# Patient Record
Sex: Male | Born: 1990 | Race: Black or African American | Hispanic: No | Marital: Single | Smoking: Never smoker
Health system: Southern US, Community
[De-identification: ages and names within clinical notes are randomized; demographics above are authoritative.]

---
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
## 2012-09-21 ENCOUNTER — Emergency Department (HOSPITAL_COMMUNITY): Payer: 59

## 2012-09-21 ENCOUNTER — Encounter (HOSPITAL_COMMUNITY): Payer: Self-pay

## 2012-09-21 ENCOUNTER — Emergency Department (HOSPITAL_COMMUNITY)
Admission: EM | Admit: 2012-09-21 | Discharge: 2012-09-21 | Disposition: A | Discharge status: Home / Self Care | Payer: 59 | Attending: Emergency Medicine | Admitting: Emergency Medicine

## 2012-09-21 DIAGNOSIS — S43016A Anterior dislocation of unspecified humerus, initial encounter: Secondary | ICD-10-CM | POA: Insufficient documentation

## 2012-09-21 DIAGNOSIS — R296 Repeated falls: Secondary | ICD-10-CM | POA: Insufficient documentation

## 2012-09-21 DIAGNOSIS — R209 Unspecified disturbances of skin sensation: Secondary | ICD-10-CM | POA: Insufficient documentation

## 2012-09-21 DIAGNOSIS — S43004A Unspecified dislocation of right shoulder joint, initial encounter: Secondary | ICD-10-CM

## 2012-09-21 DIAGNOSIS — Y9210 Unspecified residential institution as the place of occurrence of the external cause: Secondary | ICD-10-CM | POA: Insufficient documentation

## 2012-09-21 DIAGNOSIS — Y9389 Activity, other specified: Secondary | ICD-10-CM | POA: Insufficient documentation

## 2012-09-21 MED ORDER — OXYCODONE-ACETAMINOPHEN 5-325 MG PO TABS: 1.0000 | ORAL_TABLET | Freq: Four times a day (QID) | ORAL | Status: DC | PRN

## 2012-09-21 NOTE — ED Provider Notes (Signed)
CSN: [id number]     Arrival date & time 09/21/12  1441 History   First MD Initiated Contact with Patient 09/21/12 1647     Chief Complaint  Patient presents with  . Shoulder Pain   (Consider location/radiation/quality/duration/timing/severity/associated sxs/prior Treatment) Patient is a 22 y.o. male presenting with shoulder pain. The history is provided by the patient.  Shoulder Pain This is a new problem. Pertinent negatives include no chest pain, no abdominal pain, no headaches and no shortness of breath.   patient fell yesterday and has had pain in his right shoulder since. He's had decreased ability to move it. He states he has some numbness shoulder. He states he's previously dislocated her shoulder but he was able to get it back in on its own. He states that he has been pulling on it and the other prisoners in the jail were pulling on it also.  History reviewed. No pertinent past medical history. History reviewed. No pertinent past surgical history. No family history on file. History  Substance Use Topics  . Smoking status: Never Smoker   . Smokeless tobacco: Not on file  . Alcohol Use: No    Review of Systems  Constitutional: Negative for activity change and appetite change.  HENT: Negative for neck stiffness.   Eyes: Negative for pain.  Respiratory: Negative for chest tightness and shortness of breath.   Cardiovascular: Negative for chest pain and leg swelling.  Gastrointestinal: Negative for nausea, vomiting, abdominal pain and diarrhea.  Genitourinary: Negative for flank pain.  Musculoskeletal: Negative for back pain.       Right shoulder pain and decreased movement  Skin: Negative for rash.  Neurological: Positive for numbness. Negative for weakness and headaches.  Psychiatric/Behavioral: Negative for behavioral problems.    Allergies  Review of patient's allergies indicates no known allergies.  Home Medications   Current Outpatient Rx  Name  Route  Sig  Dispense   Refill  . ibuprofen (ADVIL,MOTRIN) 200 MG tablet   Oral   Take 400 mg by mouth every 8 (eight) hours as needed for pain.         Marland Kitchen oxyCODONE-acetaminophen (PERCOCET/ROXICET) 5-325 MG per tablet   Oral   Take 1-2 tablets by mouth every 6 (six) hours as needed for pain.   10 tablet   0    BP 150/74  Pulse 65  Temp(Src) 97.9 F (36.6 C) (Oral)  Resp 20  SpO2 100% Physical Exam  Constitutional: He is oriented to person, place, and time. He appears well-developed and well-nourished.  HENT:  Head: Normocephalic and atraumatic.  Eyes: Pupils are equal, round, and reactive to light.  Neck: Normal range of motion. Neck supple.  Cardiovascular: Normal rate and regular rhythm.   Pulmonary/Chest: Effort normal and breath sounds normal.  Abdominal: Soft.  Musculoskeletal: He exhibits tenderness. He exhibits no edema.  Decreased range of motion in right shoulder. He suffered a 40 with fullness anteriorly. Neurovascular intact over hand. Strong radial pulse. Decrease sensation over the right axillary muscle.  Neurological: He is alert and oriented to person, place, and time.  Skin: Skin is warm. No erythema.    ED Course  ORTHOPEDIC INJURY TREATMENT Date/Time: 09/21/2012 6:12 PM Performed by: Benjiman Core R. Authorized by: Billee Cashing Consent: Verbal consent obtained. written consent not obtained. Risks and benefits: risks, benefits and alternatives were discussed Consent given by: patient Patient understanding: patient states understanding of the procedure being performed Patient identity confirmed: verbally with patient and arm band Injury location:  shoulder Location details: right shoulder Injury type: dislocation Dislocation type: anterior Hill-Sachs deformity: no Chronicity: recurrent Pre-procedure distal perfusion: normal Pre-procedure neurological function: diminished Pre-procedure range of motion: reduced Local anesthesia used: no Patient sedated:  no Manipulation performed: yes Reduction successful: yes X-ray confirmed reduction: yes Immobilization: sling Post-procedure distal perfusion: normal Post-procedure neurological function: diminished Post-procedure range of motion: improved Patient tolerance: Patient tolerated the procedure well with no immediate complications.   (including critical care time) Labs Review Labs Reviewed - No data to display Imaging Review Dg Shoulder Right  09/21/2012   *RADIOLOGY REPORT*  Clinical Data: Status post shoulder reduction.  RIGHT SHOULDER - 2+ VIEW  Comparison: 09/21/2012.  Findings: Previously noted shoulder dislocation has been reduced. The humeral head is now located within the glenoid fossa.  No definite Hill-Sachs type fracture is confidently identified.  The inferior aspect of the glenoid appears intact.  IMPRESSION: 1.  Status post relocation of the right shoulder dislocation, which now appears properly located.   Original Report Authenticated By: Trudie Reed, M.D.   Dg Shoulder Right  09/21/2012   CLINICAL DATA:  22 year old male with right shoulder pain.  EXAM: RIGHT SHOULDER - 2+ VIEW  COMPARISON:  None.  FINDINGS: Anterior subcoracoid glenohumeral joint dislocation. No definite acute fracture. Right clavicle intact. Negative visualized right ribs and lung parenchyma.  IMPRESSION: Anterior, subcoracoid right glenohumeral joint dislocation.   Electronically Signed   By: Augusto Gamble   On: 09/21/2012 15:31    MDM   1. Shoulder dislocation, right, initial encounter    Patient with shoulder dislocation. Reduced by myself. Patient given swelling and pain medicine. Will followup with orthopedic.    Juliet Rude. Rubin Payor, MD 09/21/12 [id number]

## 2012-09-21 NOTE — ED Notes (Signed)
[place] [facility], states fell off top bunk landing on rt shoulder, possible dislocation, states hx of same

## 2014-09-14 IMAGING — CR DG SHOULDER 2+V*R*
2 series · 2 of 2 positions shown · non-contrast
Comparison: 09/21/2012.

CLINICAL DATA: Status post shoulder reduction.

RIGHT SHOULDER - 2+ VIEW

[w shoulder internal right]
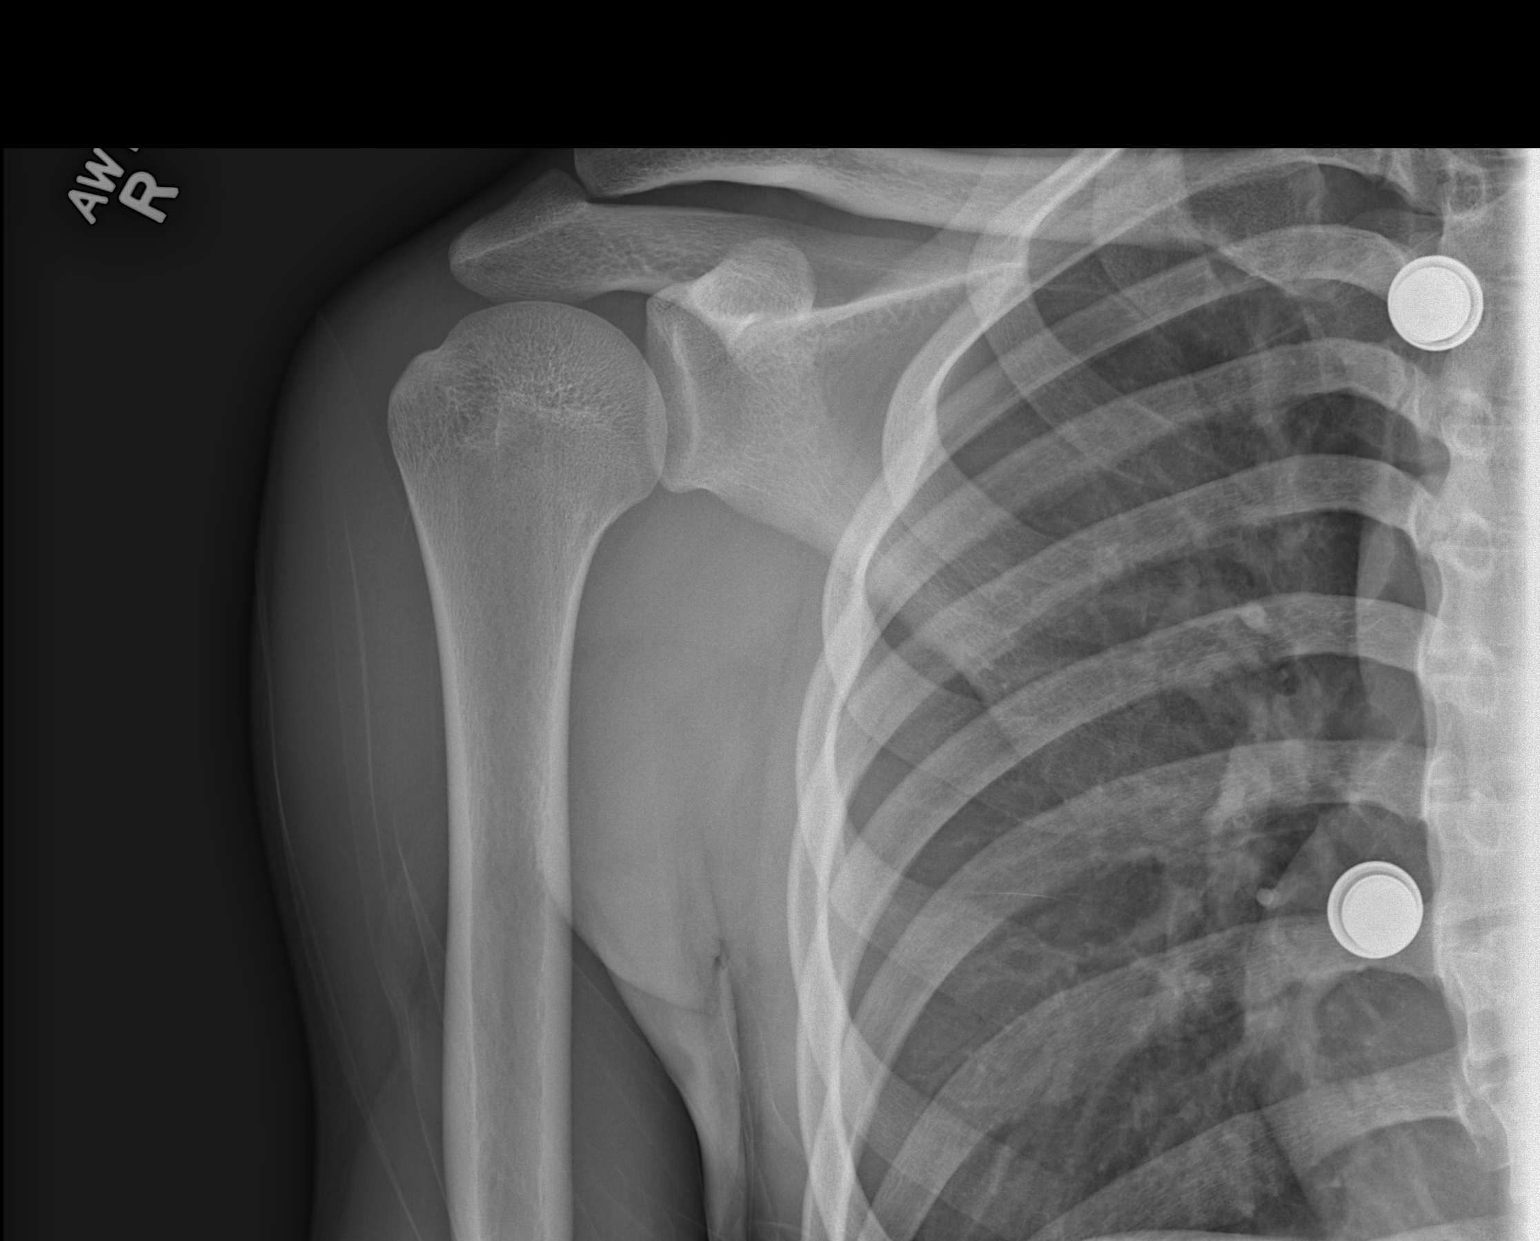

[w shoulder y-view right]
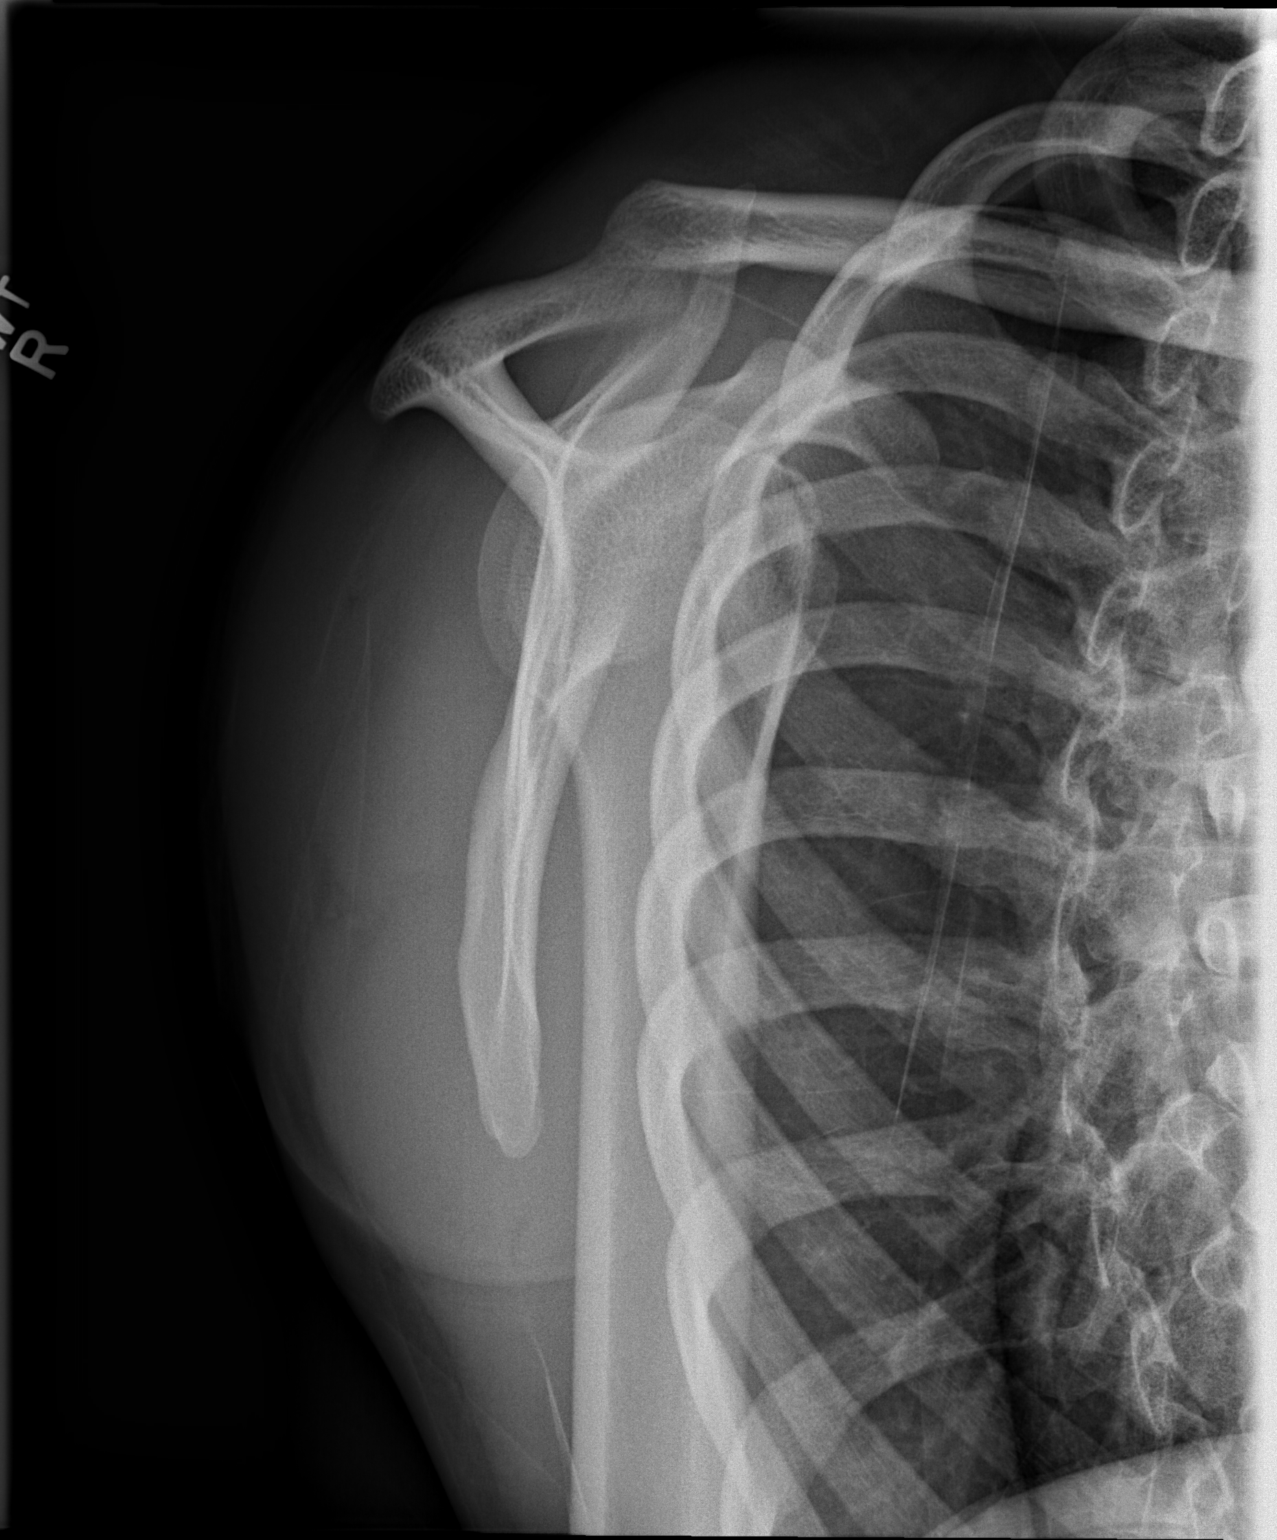

[2 of 2 positions shown; findings below may reference images not displayed]

FINDINGS: Previously noted shoulder dislocation has been reduced.
The humeral head is now located within the glenoid fossa.  No
definite Hill-Sachs type fracture is confidently identified.  The
inferior aspect of the glenoid appears intact.
IMPRESSION: 1.  Status post relocation of the right shoulder dislocation, which
now appears properly located.

## 2022-01-06 ENCOUNTER — Emergency Department (HOSPITAL_COMMUNITY)
Admission: EM | Admit: 2022-01-06 | Discharge: 2022-01-06 | Attending: Emergency Medicine | Admitting: Emergency Medicine

## 2022-01-06 DIAGNOSIS — R410 Disorientation, unspecified: Secondary | ICD-10-CM | POA: Insufficient documentation

## 2022-01-06 DIAGNOSIS — R55 Syncope and collapse: Secondary | ICD-10-CM | POA: Diagnosis not present

## 2022-01-06 DIAGNOSIS — R001 Bradycardia, unspecified: Secondary | ICD-10-CM | POA: Insufficient documentation

## 2022-01-06 DIAGNOSIS — R404 Transient alteration of awareness: Secondary | ICD-10-CM

## 2022-01-06 LAB — COMPREHENSIVE METABOLIC PANEL
ALT: 31 U/L (ref 0–44)
AST: 26 U/L (ref 15–41)
Albumin: 3.9 g/dL (ref 3.5–5.0)
Alkaline Phosphatase: 50 U/L (ref 38–126)
Anion gap: 7 (ref 5–15)
BUN: 6 mg/dL (ref 6–20)
CO2: 27 mmol/L (ref 22–32)
Calcium: 8.9 mg/dL (ref 8.9–10.3)
Chloride: 108 mmol/L (ref 98–111)
Creatinine, Ser: 1.06 mg/dL (ref 0.61–1.24)
GFR, Estimated: >60 mL/min (ref >60)
Glucose, Bld: 99 mg/dL (ref 70–99)
Potassium: 4.5 mmol/L (ref 3.5–5.1)
Sodium: 142 mmol/L (ref 135–145)
Total Bilirubin: 0.5 mg/dL (ref 0.3–1.2)
Total Protein: 6.7 g/dL (ref 6.5–8.1)

## 2022-01-06 LAB — I-STAT VENOUS BLOOD GAS, ED
Acid-Base Excess: 4 mmol/L — ABNORMAL HIGH (ref 0.0–2.0)
Bicarbonate: 30.5 mmol/L — ABNORMAL HIGH (ref 20.0–28.0)
Calcium, Ion: 1.16 mmol/L (ref 1.15–1.40)
HCT: 44 % (ref 39.0–52.0)
Hemoglobin: 15 g/dL (ref 13.0–17.0)
O2 Saturation: 44 %
Potassium: 4.5 mmol/L (ref 3.5–5.1)
Sodium: 143 mmol/L (ref 135–145)
TCO2: 32 mmol/L (ref 22–32)
pCO2, Ven: 53.2 mmHg (ref 44–60)
pH, Ven: 7.367 (ref 7.25–7.43)
pO2, Ven: 26 mmHg — CL (ref 32–45)

## 2022-01-06 LAB — CBC WITH DIFFERENTIAL/PLATELET
Abs Immature Granulocytes: 0.01 10*3/uL (ref 0.00–0.07)
Basophils Absolute: 0.1 10*3/uL (ref 0.0–0.1)
Basophils Relative: 1 %
Eosinophils Absolute: 0.2 10*3/uL (ref 0.0–0.5)
Eosinophils Relative: 4 %
HCT: 43.8 % (ref 39.0–52.0)
Hemoglobin: 14.5 g/dL (ref 13.0–17.0)
Immature Granulocytes: 0 %
Lymphocytes Relative: 21 %
Lymphs Abs: 1.4 10*3/uL (ref 0.7–4.0)
MCH: 30.8 pg (ref 26.0–34.0)
MCHC: 33.1 g/dL (ref 30.0–36.0)
MCV: 93 fL (ref 80.0–100.0)
Monocytes Absolute: 0.4 10*3/uL (ref 0.1–1.0)
Monocytes Relative: 7 %
Neutro Abs: 4.4 10*3/uL (ref 1.7–7.7)
Neutrophils Relative %: 67 %
Platelets: 276 10*3/uL (ref 150–400)
RBC: 4.71 MIL/uL (ref 4.22–5.81)
RDW: 11.9 % (ref 11.5–15.5)
WBC: 6.5 10*3/uL (ref 4.0–10.5)
nRBC: 0 % (ref 0.0–0.2)

## 2022-01-06 LAB — I-STAT CHEM 8, ED
BUN: 6 mg/dL (ref 6–20)
Calcium, Ion: 1.17 mmol/L (ref 1.15–1.40)
Chloride: 104 mmol/L (ref 98–111)
Creatinine, Ser: 1 mg/dL (ref 0.61–1.24)
Glucose, Bld: 97 mg/dL (ref 70–99)
HCT: 45 % (ref 39.0–52.0)
Hemoglobin: 15.3 g/dL (ref 13.0–17.0)
Potassium: 4.4 mmol/L (ref 3.5–5.1)
Sodium: 143 mmol/L (ref 135–145)
TCO2: 29 mmol/L (ref 22–32)

## 2022-01-06 NOTE — Discharge Instructions (Signed)
Ricardo Melton was seen in the emergency room for unresponsiveness.  He had received Narcan prior to emergency room assessment.  He did not require any further Narcan.  It is not entirely clear if there was an overdose, but Ricardo Melton did not receive any intervention in the emergency room.  All of his basic labs including renal function, hemoglobin, white count are normal.  Over time, Ricardo Melton became more responsive.  He has tolerated oral intake.  We would recommend that Ricardo Melton be monitored or observed closely upon arrival tonight.  Neurochecks every 2 hours are recommended until midnight.  If there is any change in his mental status, please bring him to the emergency room.

## 2022-01-06 NOTE — ED Triage Notes (Signed)
Pt BIB [facility] in sheriff custody from jail after being discovered unresponsive in his cell. Patient given 12mg  of narcan prior to EMS arrival. Patient currently sleeping with pinpoint pupils, HR 49, O2 98% 2L, 13 RR. Patient wakes to sternal rub only.

## 2022-01-06 NOTE — ED Notes (Signed)
Pt alert, eating.

## 2022-01-06 NOTE — ED Notes (Signed)
Pt d/c back to [facility] custody per EDP order. Discharge summary reviewed, verbalize understanding. No s/s of acute distress noted at discharge.

## 2022-01-06 NOTE — ED Notes (Signed)
Patient remains somnolent at this time, patient will wake to painful stimuli but falls back asleep immediately after. Breathing is regular and unlabored, airway remains patent.

## 2022-01-06 NOTE — ED Provider Notes (Signed)
[facility] EMERGENCY DEPARTMENT Provider Note   CSN: [id number] Arrival date & time: 01/06/22  1439     History  Chief Complaint  Patient presents with   Altered Mental Status    Latoya Zane is a 31 y.o. male.  HPI    31 year old male comes in with chief complaint of altered mental status from the prison system. According to the police who has accompanied the patient, patient was found unresponsive in his jail.  He was witnessed 2 hours prior to that episode and was appropriate.  Patient had received Narcan prior to EMS arrival.  Nursing note indicates 12 mg of Narcan was given prior to EMS arrival, police at the bedside does not know how much Narcan was given.   Level 5 caveat for altered mental status.   Home Medications Prior to Admission medications   Medication Sig Start Date End Date Taking? Authorizing Provider  ibuprofen (ADVIL,MOTRIN) 200 MG tablet Take 400 mg by mouth every 8 (eight) hours as needed for pain.    [provider]  oxyCODONE-acetaminophen (PERCOCET/ROXICET) 5-325 MG per tablet Take 1-2 tablets by mouth every 6 (six) hours as needed for pain. 09/21/12   Benjiman Core, MD      Allergies    Patient has no known allergies.    Review of Systems   Review of Systems  Physical Exam Updated Vital Signs BP 132/70   Pulse (!) 53   Temp (!) 97.5 F (36.4 C) (Axillary)   Resp 18   SpO2 94%  Physical Exam Vitals and nursing note reviewed.  Constitutional:      Appearance: He is well-developed.     Comments: Responsive to sternal rub  HENT:     Head: Atraumatic.  Eyes:     Comments: Pupils are pinpoint  Cardiovascular:     Rate and Rhythm: Bradycardia present.     Pulses: Normal pulses.  Pulmonary:     Effort: Pulmonary effort is normal.     Breath sounds: No stridor. No wheezing.  Abdominal:     Tenderness: There is no abdominal tenderness.  Musculoskeletal:     Cervical back: Neck supple.  Skin:    General:  Skin is warm.  Neurological:     Mental Status: He is disoriented.     Comments: Patient mumbling, protecting airway     ED Results / Procedures / Treatments   Labs (all labs ordered are listed, but only abnormal results are displayed) Labs Reviewed  I-STAT VENOUS BLOOD GAS, ED - Abnormal; Notable for the following components:      Result Value   pO2, Ven 26 (*)    Bicarbonate 30.5 (*)    Acid-Base Excess 4.0 (*)    All other components within normal limits  COMPREHENSIVE METABOLIC PANEL  CBC WITH DIFFERENTIAL/PLATELET  BLOOD GAS, VENOUS  I-STAT CHEM 8, ED    EKG None  Radiology No results found.  Procedures .Critical Care  Performed by: Derwood Kaplan, MD Authorized by: Derwood Kaplan, MD   Critical care provider statement:    Critical care time (minutes):  48   Critical care was necessary to treat or prevent imminent or life-threatening deterioration of the following conditions:  Toxidrome and CNS failure or compromise   Critical care was time spent personally by me on the following activities:  Development of treatment plan with patient or surrogate, discussions with consultants, evaluation of patient's response to treatment, examination of patient, ordering and review of laboratory studies, ordering and  review of radiographic studies, ordering and performing treatments and interventions, pulse oximetry, re-evaluation of patient's condition and review of old charts     Medications Ordered in ED Medications - No data to display  ED Course/ Medical Decision Making/ A&P Clinical Course as of 01/06/22 Halford Chessman Jan 06, 2022  1630 Patient reassessed.  Labs are reassuring including venous blood gas that shows pH of 7.367.  Patient still very somnolent, arousable to sternal rub only. [AN]  1730 Patient still arousable to sternal rub.  Labs are reassuring.  Pupils are about 2 mm.  No hypercapnia or hypoxia noted.  Hemodynamically stable. [AN]  1828 Patient responded to  sternal rub, now able to communicate. Patient states that felt like his throat was closing and he could not breathe.  He denies taking any substance.  He has no known history of allergies.  He denied any chest pain, shortness of breath prior to the episode.  He does not recall if the symptoms were after he had something to eat.  High clinical suspicion for overdose. Patient had no point had hypoxia, wheezing, rash and currently his oral exam is reassuring, there is no evidence of any stridor.  Clinically does not appear that he had anaphylaxis.  He was never given any antidotes for anaphylaxis, and he has improved.  He is requesting food.  P.o. challenge initiated.  Anticipate discharge. [AN]    Clinical Course User Index [AN] Derwood Kaplan, MD                           Medical Decision Making 31 year old male comes to the emergency room with chief complaint of unresponsiveness.  According to the police at the bedside, 2 hours prior to assessment he was found fine.  Thereafter he was found unresponsive.  Narcan was administered.  EMS report consistent with Narcan administered.  Patient has pinpoint pupils.  He is arousable to sternal rub.  Differential diagnosis at this time includes accidental overdose with opiate, alcohol intoxication, suicide attempt, severe electrolyte abnormality.  Patient has regular rhythm, heart rate in the 50s.  He is moving all 4 extremities to noxious stimuli.  Low suspicion for stroke.  Plan is to observe him in the ER, will get basic labs.  Clinically appears that this is opiate overdose. When he is more arousable, will get more comprehensive history.  Problems Addressed: Unresponsive episode: acute illness or injury that poses a threat to life or bodily functions  Amount and/or Complexity of Data Reviewed Labs: ordered.    Final Clinical Impression(s) / ED Diagnoses Final diagnoses:  Unresponsive episode    Rx / DC Orders ED Discharge Orders      None         Derwood Kaplan, MD 01/06/22 Paulo Fruit

## 2022-09-08 ENCOUNTER — Ambulatory Visit (HOSPITAL_COMMUNITY)
Admission: EM | Admit: 2022-09-08 | Discharge: 2022-09-08 | Disposition: A | Discharge status: Home / Self Care | Payer: No Payment, Other | Attending: Registered Nurse | Admitting: Registered Nurse

## 2022-09-08 ENCOUNTER — Encounter (HOSPITAL_COMMUNITY): Payer: Self-pay | Admitting: Registered Nurse

## 2022-09-08 DIAGNOSIS — F209 Schizophrenia, unspecified: Secondary | ICD-10-CM | POA: Insufficient documentation

## 2022-09-08 DIAGNOSIS — Z760 Encounter for issue of repeat prescription: Secondary | ICD-10-CM

## 2022-09-08 DIAGNOSIS — Z652 Problems related to release from prison: Secondary | ICD-10-CM | POA: Insufficient documentation

## 2022-09-08 MED ORDER — OLANZAPINE 15 MG PO TABS: 30.0000 mg | ORAL_TABLET | Freq: Every day | ORAL | 0 refills | Status: AC

## 2022-09-08 MED ORDER — CARBAMAZEPINE ER 100 MG PO TB12: 300.0000 mg | ORAL_TABLET | Freq: Two times a day (BID) | ORAL | 0 refills | Status: AC

## 2022-09-08 MED ORDER — BUSPIRONE HCL 15 MG PO TABS: ORAL_TABLET | ORAL | 0 refills | Status: AC

## 2022-09-08 NOTE — ED Provider Notes (Signed)
[facility] Medical Screening Exam  Patient Name: Ricardo Melton MRN: [id number] Date of Evaluation: 09/08/22 Chief Complaint:   Diagnosis:  Final diagnoses:  Schizophrenia, unspecified type (HCC)  Encounter for medication refill    History of Present illness: Ricardo Melton is a 32 y.o. male patient presented to [facility] as a walk in voluntarily requesting outpatient psychiatric services and medication refill.    Josefa Half, 32 y.o., male patient seen face to face by this provider, chart reviewed, and consulted with Dr. Nelly Rout on 09/08/22.  On evaluation Ricardo Melton reports he was released from jail 3 days ago and was not given a prescription for his medication.  States while in jail he was receiving Lamictal 300 mg 2 x day, Zyprexa 30 mg at bed time, and Buspar but doesn't remember the dosage.  Patient states he has a history of schizophrenia.  He denies suicidal/self-harm/homicidal ideation, psychosis, and paranoia at this time but is wanting to get back on his psychotropic medications.  Unable to contact pharmacy since patient was getting medications while in jail, and he doesn't have any bottles to show how much of each medication he was taking.  Consulted with pharmacy to see if able to check medication dosages.  .   During evaluation Ricardo Melton is seated in exam room with no noted distress.  He is alert/oriented x 4, calm, cooperative, attentive, and responses were relevant and appropriate to assessment questions.  He spoke in a clear tone at moderate volume, and normal pace, with good eye contact.   He denies suicidal/self-harm/homicidal ideation, psychosis, and paranoia.  Objectively there is no evidence of psychosis/mania or delusional thinking.  He conversed coherently, with goal directed thoughts, and no distractibility, or pre-occupation.  At this time Ricardo Melton is educated and verbalizes understanding of mental health resources and other crisis services in  the community. He is instructed to call 911 and present to the nearest emergency room should he experience any suicidal/homicidal ideation, auditory/visual/hallucinations, or detrimental worsening of his mental health condition.  He was a also advised by Clinical research associate that he could call the toll-free phone on back of Medicaid card to speak with care coordinator.  F/U with [facility] for medication management   Flowsheet Row ED from 09/08/2022 in [facility]  C-SSRS RISK CATEGORY No Risk       Psychiatric Specialty Exam  Presentation  General Appearance:Appropriate for Environment  Eye Contact:Good  Speech:Clear and Coherent  Speech Volume:Normal  Handedness:Right   Mood and Affect  Mood: Euthymic  Affect: Appropriate; Congruent   Thought Process  Thought Processes: Coherent; Goal Directed  Descriptions of Associations:Intact  Orientation:Full (Time, Place and Person)  Thought Content:Logical    Hallucinations:None  Ideas of Reference:None  Suicidal Thoughts:No  Homicidal Thoughts:No   Sensorium  Memory: Immediate Good; Recent Good  Judgment: Intact  Insight: Present   Executive Functions  Concentration: Good  Attention Span: Good  Recall: Good  Fund of Knowledge: Good  Language: Good   Psychomotor Activity  Psychomotor Activity: Normal   Assets  Assets: Communication Skills; Desire for Improvement; Housing; Social Support   Sleep  Sleep: Good  Number of hours: No data recorded  Physical Exam: Physical Exam Vitals and nursing note reviewed.  Constitutional:      General: He is not in acute distress.    Appearance: Normal appearance. He is not ill-appearing.  HENT:     Head: Normocephalic.  Eyes:     Conjunctiva/sclera: Conjunctivae  normal.  Cardiovascular:     Rate and Rhythm: Normal rate.  Pulmonary:     Effort: Pulmonary effort is normal.  Musculoskeletal:        General: Normal range of  motion.     Cervical back: Normal range of motion.  Skin:    General: Skin is warm and dry.  Neurological:     Mental Status: He is alert and oriented to person, place, and time.  Psychiatric:        Attention and Perception: Attention and perception normal. He does not perceive auditory or visual hallucinations.        Mood and Affect: Mood and affect normal.        Speech: Speech normal.        Behavior: Behavior normal. Behavior is cooperative.        Thought Content: Thought content normal. Thought content is not paranoid or delusional. Thought content does not include homicidal or suicidal ideation.        Cognition and Memory: Cognition normal.        Judgment: Judgment normal.    Review of Systems  Constitutional:        No other complaints voiced   All other systems reviewed and are negative.  Blood pressure (!) 141/83, pulse 78, temperature 97.9 F (36.6 C), temperature source Oral, resp. rate 18, SpO2 98%. There is no height or weight on file to calculate BMI.  Musculoskeletal: Strength & Muscle Tone: within normal limits Gait & Station: normal Patient leans: N/A   [facility] MSE Discharge Disposition for Follow up and Recommendations: Based on my evaluation the patient does not appear to have an emergency medical condition and can be discharged with resources and follow up care in outpatient services for Medication Management and Individual Therapy  Meds ordered this encounter  Medications   carbamazepine (TEGRETOL XR) 100 MG 12 hr tablet    Sig: Take 3 tablets (300 mg total) by mouth 2 (two) times daily.    Dispense:  132 tablet    Refill:  0    Order Specific Question:   Supervising Provider    Answer:   Lucianne Muss, ARCHANA [[id number]]   OLANZapine (ZYPREXA) 15 MG tablet    Sig: Take 2 tablets (30 mg total) by mouth at bedtime.    Dispense:  74 tablet    Refill:  0    Order Specific Question:   Supervising Provider    Answer:   Nelly Rout [[id number]]   busPIRone (BUSPAR)  15 MG tablet    Sig: Take one tablet (15 mg) in morning and 2 tablet (30 mg) at bed time    Dispense:  135 tablet    Refill:  0    Order Specific Question:   Supervising Provider    Answer:   Nelly Rout [[id number]]       Ravonda Brecheen, NP 09/08/2022, 1:31 PM

## 2022-09-08 NOTE — Discharge Instructions (Signed)
[facility]: Outpatient psychiatric Services:   Please see the walk in hours listed below.  Medication Management New Patient needing Medication Management Walk-in, and Existing Patients needing to see a provider for management coming as a walk in   Monday thru Friday 8:00 AM first come first serve until slots are full.  Recommend being there by 7:15 AM to ensure a slot is open.  Therapy New Patient Therapy Intake and Existing Patients needing to see therapist coming in as a walk in.   Monday, Wednesday, and Thursday morning at 8:00 am first come first serve.  Recommend being there by 7:15 AM to ensure a slot is open.    Every 1st, 2nd, and 3rd Friday at 1:00 PM first come first serve until slots are full.  Will still need to come in that morning at 7:15 AM to get registered for an afternoon slot.  For all walk-ins we ask that you arrive by 7:15 am because patients will be seen in there order of arrival (FIRST COME FIRST SERVE) Availability is limited, therefore you may not be seen on the same day that you walk in if all slots are full.    Our goal is to serve and meet the needs of our community to the best of our ability.     [facility] Phone: [phone] Physical Address:  [address], [address] [place], [place]  [address]  Outpatient Services Life can be a challenge for [place] all. [facility] outpatient services offer a caring and experienced team of professionals who help people take the first step, which is often the most difficult. Together, we develop a well-defined and customized plan for each person that meets the individual's needs and goals. Each plan includes evidence-based practices as proven strategies that work. From board-certified psychiatrists, registered nurses, therapists, and outpatient office administrative professionals--all care and want to help you and your loved ones in every way  possible to ensure you succeed.  Open Access:   One way we ensure we get people the help they need when they request is is through Open Access. This service encourages individuals who are in dire need of our services and are new to [facility] to simply walk in or call us for virtual options, Monday through Friday between 8 a.m. and 3 p.m. On the same day of contact, if the individual has time to do so, he/she/they will complete patient registration and a comprehensive clinical assessment with a therapist. The assessment will provide treatment recommendations and the individual will leave with an appointment for the next service or a referral to the proper level of care.  While this process takes a few hours and is longer than a traditional appointment, it reduces what could otherwise be months of waiting for help or an appointment.   Telehealth Services:  [facility]'s telehealth services provide a safe, secure, and easy way to connect with a therapist or mental health provider for an individual or group therapy appointment. Click here to learn more about how [facility]'s telehealth services provide an important treatment option. These services may be accessed from the comfort of an individual's home, or at one of [facility]'s behavioral health offices such as this one where an individual may use on-site equipment for the visit.   Telehealth Services   A SAFE, SECURE, CONVENIENT TREATMENT OPTION:  [facility] telehealth services provide you with a safe, secure, and easy way to connect with your therapist or mental health provider for  an individual or group therapy appointment.  Using Psychologist, prison and probation services, telehealth appointments allow you to meet with [employer], therapists, nurse practitioners, and psychiatrists from your desktop or laptop computer, cell phone, or tablet device. Telehealth visits are compliant with all Health Insurance Portability and Accountability Act (HIPAA) requirements and you can  complete a telehealth visit from just about anywhere using internet or wi-fi access.  HOW DOES IT WORK?  [facility] uses the [employer] platform to host telehealth appointments. Prior to your scheduled visit, you will receive a direct link via text or email which will take you to your provider's online waiting room. Simply click that link at your appointment time and your provider will be notified that you've arrived. He or she will meet you online and you will complete your visit. Your provider may also have resources and information posted in his or her virtual waiting room which you may find helpful throughout your treatment.  In addition, you may receive a reminder telephone call from a [facility] team member in the days leading up to your appointment. During that call, you will have an opportunity to provide important health information and medication updates which may save time during your scheduled appointment.     WHO USES TELEHEALTH SERVICES?  Telehealth services provide an alternative to in-person, face-to-face treatment for individuals receiving outpatient behavioral health services. At [facility], telehealth visits may also be used by individuals receiving Assertive Community Treatment (ACT) Team and Individual Placement and Support (IPS) services and other community-based, specialized services as needed. Telehealth services are also used for group therapy sessions, allowing people we support to connect during treatment with others who have similar experiences.   Based on what you have shared, a list of resources for outpatient therapy and psychiatry is provided below to get you started back on treatment.  It is imperative that you follow through with treatment within 5-7 days from the day of discharge to prevent any further risk to your safety or mental well-being.  You are not limited to the list provided.  In case of an urgent crisis, you may contact the Mobile Crisis Unit with [facility] at [phone].        Outpatient Services for Therapy and Medication Management for [facility] [address], [place], [address] [phone] phone  New Patient Assessment/Therapy Walk-ins Monday and Wednesday: 8am until slots are full. Every 1st and 2nd Friday: 1pm - 5pm  NO ASSESSMENT/THERAPY WALK-INS ON TUESDAYS OR THURSDAYS  New Patient Psychiatry/Medication Management Walk-ins Monday-Friday: 8am-11am  For all walk-ins, we ask that you arrive by 7:30am because patient will be seen in the order of arrival.  Availability is limited; therefore, you may not be seen on the same day that you walk-in.  Our goal is to serve and meet the needs of our community to the best of our ability.   [facility] [address] [place], [place], [address] [phone] phone  [facility] [address] [place], [place], [address] [phone] phone [phone] phone ([address], Anthem/Elevance, [address], [address], [address], [address], Healthy [place], [place], [place], [address], [employer], [place], UHC, [employer], [place], Out of Network)  [employer], [place] [address] [place], [place], [address] [phone] phone ([place], Anthem/Elevance, [employer] Options/Carelon, BCBS, [address], [place], [place], [place], [place], [employer], [place], [place], [place], [facility])  [employer] [address] [place], [place],  [address] [phone] phone (Medicaid, ask about other insurance)  [facility] [address] [place], [place], [address] [phone] phone [phone] fax ([address] [place] , [place], [place], [place] Health Choice, UHC, [employer] Electric, Self-Pay)  [facility] [address] [place], [place], [address] [phone] phone ([address] Anthem/Elevance, [address], [address], [place], [employer], [place], [place], [place], [employer], [place], [place], [place], [facility])  [facility] - 6-8 MONTH WAIT FOR THERAPY; SOONER FOR MEDICATION MANAGEMENT [address] [place], [place], [address] [phone] phone ([address] [employer] [address] - [place], [address], [place], [place], [employer] Health Plans, [address], BCBS Healthy [place], [place], [address], [place], [place], [place], [place], Tricare, UHC, [employer], [place])  [facility] [address] [place], [place], [address] [phone] phone  [facility] [address] [place], [place], [address] [phone] phone  [facility] [address], [address] [place], [place], [address] [phone] phone  [facility] - THERAPY ONLY [address], [place], [address] [phone] phone  [facility], [place] [address], [place], [address] [phone] phone  [facility] [address] [place], [place], [address] [phone] phone [phone] fax  [facility] [address] [place], [place], [address] [phone] phone [phone] fax  [facility] [phone] [address] [place], [place], [address] [phone] phone  Jovita Kussmaul [address] [place], [place], [address]  [phone] phone  [facility]  (Adults Only) [address] [place], [place], [address]  [phone] phone [phone] fax

## 2022-09-08 NOTE — Progress Notes (Addendum)
   09/08/22 1049  [facility] Triage Screening (Walk-ins at [facility] only)  How Did You Hear About [place]? Self  What Is the Reason for Your Visit/Call Today? Pt presents to [facility] voluntarily unaccompanied seeking medication management. Pt reports he was in jail for 9 months and was released 3 days ago without medication. Pt reports being diagnosed with Schizophrenia, Bipolar disorder, personality disorder, anxiety, and ADHD. Pt states he was prescrribed  Buspar, Zyprexa, and tegretol and was given this medication was incarcerated. Pt is looking to get established with outpatient psychiatry. Pt has an outpatient appointment for October 1st,2024 at 8am at [facility] for medication management.Pt denies SI/HI and AVH.  How Long Has This Been Causing You Problems? <Week  Have You Recently Had Any Thoughts About Hurting Yourself? No  Are You Planning to Commit Suicide/Harm Yourself At This time? No  Have you Recently Had Thoughts About Hurting Someone Karolee Ohs? No  Are You Planning To Harm Someone At This Time? No  Are you currently experiencing any auditory, visual or other hallucinations? No  Have You Used Any Alcohol or Drugs in the Past 24 Hours? No  Do you have any current medical co-morbidities that require immediate attention? No  Clinician description of patient physical appearance/behavior: anxious  What Do You Feel Would Help You the Most Today? Medication(s)  If access to [facility] was not available, would you have sought care in the Emergency Department? No  Determination of Need Routine (7 days)  Options For Referral Outpatient Therapy;Medication Management

## 2022-09-08 NOTE — ED Notes (Addendum)
Patient was discharged by the provider. Patient received his AVS with community resources.

## 2022-10-21 ENCOUNTER — Ambulatory Visit (HOSPITAL_COMMUNITY): Payer: No Payment, Other | Admitting: Student

## 2022-10-21 NOTE — Progress Notes (Deleted)
Psychiatric Initial Adult Assessment  Patient Identification: Ricardo Melton MRN:  [id number] Date of Evaluation:  10/21/2022 Referral Source: [facility]  Assessment:  Ricardo Melton is a 32 y.o. male with a history of *** who presents in person to [facility] for initial evaluation of ***.  Patient reports ***  Risk Assessment: A suicide and violence risk assessment was performed as part of this evaluation. The patient is deemed to be at chronic elevated risk for self-harm/suicide given the following factors: {SABSUICIDERISKFACTORS:29780}. These risk factors are mitigated by the following factors: {SABSUICIDEPROTECTIVEFACTORS:29779}. There is no acute risk for suicide or violence at this time. The patient was educated about relevant modifiable risk factors including following recommendations for treatment of psychiatric illness and abstaining from substance abuse.  While future psychiatric events cannot be accurately predicted, the patient does not currently require acute inpatient psychiatric care and does not currently meet [facility] involuntary commitment criteria.    Plan:  # *** Past medication trials:  Interventions: -- ***  # *** Past medication trials:  Interventions: -- ***  # *** Past medication trials:  Interventions: -- ***  Patient was given contact information for behavioral health clinic and was instructed to call 911 for emergencies.    Subjective:  Chief Complaint: No chief complaint on file.   History of Present Illness:   Because this is my first time meeting the patient, relevant social history was collected.  Please see the social history section below.   SocHx  Safety  ROS  Q Prison, opioid OD 6 mo ago   HPI CC Enc: few Self reported Hx schiz, not given meds at release Lam 300 bid, Zypr 30, Bu unkn SBR sent in teg 300 bid ???, zypr 30, Bus 15/30   Plan  Labs (CBC/CMP fine 1 yr ago), PCP - EKG     Past  Psychiatric History:  ***  Substance Abuse History in the last 12 months:  {yes no:314532}  Past Medical History: No past medical history on file. No past surgical history on file.  Family Psychiatric History: ***  Family History: No family history on file.  Social History:   ***  Additional Social History: updated  Allergies:  No Known Allergies  Current Medications: Current Outpatient Medications  Medication Sig Dispense Refill   busPIRone (BUSPAR) 15 MG tablet Take one tablet (15 mg) in morning and 2 tablet (30 mg) at bed time 135 tablet 0   carbamazepine (TEGRETOL XR) 100 MG 12 hr tablet Take 3 tablets (300 mg total) by mouth 2 (two) times daily. 132 tablet 0   OLANZapine (ZYPREXA) 15 MG tablet Take 2 tablets (30 mg total) by mouth at bedtime. 74 tablet 0   No current facility-administered medications for this visit.    Psychiatric Specialty Exam: Physical Exam Constitutional:      Appearance: the patient is not toxic-appearing.  Pulmonary:     Effort: Pulmonary effort is normal.  Neurological:     General: No focal deficit present.     Mental Status: the patient is alert and oriented to person, place, and time.   Review of Systems  Respiratory:  Negative for shortness of breath.   Cardiovascular:  Negative for chest pain.  Gastrointestinal:  Negative for abdominal pain, constipation, diarrhea, nausea and vomiting.  Neurological:  Negative for headaches.      There were no vitals taken for this visit.  General Appearance: Fairly Groomed  Eye Contact:  Good  Speech:  Clear and Coherent  Volume:  Normal  Mood:  Euthymic  Affect:  Congruent  Thought Process:  Coherent  Orientation:  Full (Time, Place, and Person)  Thought Content: Logical   Suicidal Thoughts:  No  Homicidal Thoughts:  No  Memory:  Immediate;   Good  Judgement:  fair  Insight:  fair  Psychomotor Activity:  Normal  Concentration:  Concentration: Good  Recall:  Good  Fund of Knowledge:  Good  Language: Good  Akathisia:  No  Handed:  not assessed  AIMS (if indicated): not done  Assets:  Communication Skills Desire for Improvement Financial Resources/Insurance Housing Leisure Time Physical Health  ADL's:  Intact  Cognition: WNL  Sleep:  Fair      Metabolic Disorder Labs: No results found for: "HGBA1C", "MPG" No results found for: "PROLACTIN" No results found for: "CHOL", "TRIG", "HDL", "CHOLHDL", "VLDL", "LDLCALC" No results found for: "TSH"  Therapeutic Level Labs: No results found for: "LITHIUM" No results found for: "CBMZ" No results found for: "VALPROATE"  Screenings:  Flowsheet Row ED from 09/08/2022 in [facility]  C-SSRS RISK CATEGORY No Risk       Collaboration of Care: Collaboration of Care: Other none  Patient/Guardian was advised Release of Information must be obtained prior to any record release in order to collaborate their care with an outside provider. Patient/Guardian was advised if they have not already done so to contact the registration department to sign all necessary forms in order for [place] to release information regarding their care.   Consent: Patient/Guardian gives verbal consent for treatment and assignment of benefits for services provided during this visit. Patient/Guardian expressed understanding and agreed to proceed.   A total of 60 minutes was spent involved in face to face clinical care, chart review, documentation.  Carlyn Reichert, MD PGY-3

## 2023-03-05 ENCOUNTER — Other Ambulatory Visit (HOSPITAL_BASED_OUTPATIENT_CLINIC_OR_DEPARTMENT_OTHER): Payer: Self-pay

## 2023-03-05 ENCOUNTER — Other Ambulatory Visit: Payer: Self-pay

## 2023-03-05 ENCOUNTER — Emergency Department (HOSPITAL_BASED_OUTPATIENT_CLINIC_OR_DEPARTMENT_OTHER)
Admission: EM | Admit: 2023-03-05 | Discharge: 2023-03-05 | Disposition: A | Discharge status: Home / Self Care | Payer: Medicaid Other | Attending: Emergency Medicine | Admitting: Emergency Medicine

## 2023-03-05 DIAGNOSIS — E878 Other disorders of electrolyte and fluid balance, not elsewhere classified: Secondary | ICD-10-CM | POA: Diagnosis not present

## 2023-03-05 DIAGNOSIS — J101 Influenza due to other identified influenza virus with other respiratory manifestations: Secondary | ICD-10-CM | POA: Diagnosis not present

## 2023-03-05 DIAGNOSIS — R112 Nausea with vomiting, unspecified: Secondary | ICD-10-CM

## 2023-03-05 DIAGNOSIS — R197 Diarrhea, unspecified: Secondary | ICD-10-CM | POA: Diagnosis not present

## 2023-03-05 DIAGNOSIS — D72819 Decreased white blood cell count, unspecified: Secondary | ICD-10-CM | POA: Insufficient documentation

## 2023-03-05 DIAGNOSIS — R944 Abnormal results of kidney function studies: Secondary | ICD-10-CM | POA: Diagnosis not present

## 2023-03-05 DIAGNOSIS — E876 Hypokalemia: Secondary | ICD-10-CM | POA: Diagnosis not present

## 2023-03-05 DIAGNOSIS — M791 Myalgia, unspecified site: Secondary | ICD-10-CM

## 2023-03-05 DIAGNOSIS — R42 Dizziness and giddiness: Secondary | ICD-10-CM | POA: Diagnosis not present

## 2023-03-05 DIAGNOSIS — R051 Acute cough: Secondary | ICD-10-CM

## 2023-03-05 DIAGNOSIS — R519 Headache, unspecified: Secondary | ICD-10-CM | POA: Diagnosis present

## 2023-03-05 DIAGNOSIS — D751 Secondary polycythemia: Secondary | ICD-10-CM | POA: Diagnosis not present

## 2023-03-05 LAB — COMPREHENSIVE METABOLIC PANEL
ALT: 17 U/L (ref 0–44)
AST: 25 U/L (ref 15–41)
Albumin: 4.8 g/dL (ref 3.5–5.0)
Alkaline Phosphatase: 66 U/L (ref 38–126)
Anion gap: 13 (ref 5–15)
BUN: 14 mg/dL (ref 6–20)
CO2: 34 mmol/L — ABNORMAL HIGH (ref 22–32)
Calcium: 9.4 mg/dL (ref 8.9–10.3)
Chloride: 89 mmol/L — ABNORMAL LOW (ref 98–111)
Creatinine, Ser: 1.36 mg/dL — ABNORMAL HIGH (ref 0.61–1.24)
GFR, Estimated: >60 mL/min (ref >60)
Glucose, Bld: 107 mg/dL — ABNORMAL HIGH (ref 70–99)
Potassium: 3 mmol/L — ABNORMAL LOW (ref 3.5–5.1)
Sodium: 136 mmol/L (ref 135–145)
Total Bilirubin: 0.4 mg/dL (ref 0.0–1.2)
Total Protein: 8.6 g/dL — ABNORMAL HIGH (ref 6.5–8.1)

## 2023-03-05 LAB — RESP PANEL BY RT-PCR (RSV, FLU A&B, COVID)  RVPGX2
Influenza A by PCR: POSITIVE — AB
Influenza B by PCR: NEGATIVE
Resp Syncytial Virus by PCR: NEGATIVE
SARS Coronavirus 2 by RT PCR: NEGATIVE

## 2023-03-05 LAB — DIFFERENTIAL
Abs Immature Granulocytes: 0.01 10*3/uL (ref 0.00–0.07)
Basophils Absolute: 0 10*3/uL (ref 0.0–0.1)
Basophils Relative: 2 %
Eosinophils Absolute: 0 10*3/uL (ref 0.0–0.5)
Eosinophils Relative: 0 %
Immature Granulocytes: 0 %
Lymphocytes Relative: 55 %
Lymphs Abs: 1.4 10*3/uL (ref 0.7–4.0)
Monocytes Absolute: 0.4 10*3/uL (ref 0.1–1.0)
Monocytes Relative: 15 %
Neutro Abs: 0.7 10*3/uL — ABNORMAL LOW (ref 1.7–7.7)
Neutrophils Relative %: 28 %
Smear Review: NORMAL

## 2023-03-05 LAB — MAGNESIUM: Magnesium: 2.1 mg/dL (ref 1.7–2.4)

## 2023-03-05 LAB — CBC
HCT: 52.1 % — ABNORMAL HIGH (ref 39.0–52.0)
Hemoglobin: 17.5 g/dL — ABNORMAL HIGH (ref 13.0–17.0)
MCH: 29.3 pg (ref 26.0–34.0)
MCHC: 33.6 g/dL (ref 30.0–36.0)
MCV: 87.3 fL (ref 80.0–100.0)
Platelets: 232 10*3/uL (ref 150–400)
RBC: 5.97 MIL/uL — ABNORMAL HIGH (ref 4.22–5.81)
RDW: 13.2 % (ref 11.5–15.5)
WBC: 2.5 10*3/uL — ABNORMAL LOW (ref 4.0–10.5)
nRBC: 0 % (ref 0.0–0.2)

## 2023-03-05 LAB — LIPASE, BLOOD: Lipase: 33 U/L (ref 11–51)

## 2023-03-05 MED ORDER — IBUPROFEN 600 MG PO TABS
600.0000 mg | ORAL_TABLET | Freq: Four times a day (QID) | ORAL | 0 refills | Status: DC | PRN
  Filled 2023-03-05: qty 30, 8d supply, fill #0

## 2023-03-05 MED ORDER — IBUPROFEN 600 MG PO TABS: 600.0000 mg | ORAL_TABLET | Freq: Four times a day (QID) | ORAL | 0 refills | Status: AC | PRN

## 2023-03-05 MED ORDER — SODIUM CHLORIDE 0.9 % IV BOLUS
1000.0000 mL | Freq: Once | INTRAVENOUS | Status: AC
  Administered 2023-03-05: 1000 mL via INTRAVENOUS

## 2023-03-05 MED ORDER — KETOROLAC TROMETHAMINE 15 MG/ML IJ SOLN
15.0000 mg | Freq: Once | INTRAMUSCULAR | Status: AC
  Administered 2023-03-05: 15 mg via INTRAVENOUS
  Filled 2023-03-05: qty 1

## 2023-03-05 MED ORDER — POTASSIUM CHLORIDE CRYS ER 20 MEQ PO TBCR: 20.0000 meq | EXTENDED_RELEASE_TABLET | Freq: Every day | ORAL | 0 refills | Status: AC

## 2023-03-05 MED ORDER — ONDANSETRON 4 MG PO TBDP: 4.0000 mg | ORAL_TABLET | Freq: Three times a day (TID) | ORAL | 0 refills | Status: AC | PRN

## 2023-03-05 MED ORDER — ONDANSETRON 4 MG PO TBDP
4.0000 mg | ORAL_TABLET | Freq: Three times a day (TID) | ORAL | 0 refills | Status: DC | PRN
  Filled 2023-03-05: qty 20, 7d supply, fill #0

## 2023-03-05 MED ORDER — POTASSIUM CHLORIDE CRYS ER 20 MEQ PO TBCR
20.0000 meq | EXTENDED_RELEASE_TABLET | Freq: Every day | ORAL | 0 refills | Status: DC
  Filled 2023-03-05: qty 3, 3d supply, fill #0

## 2023-03-05 MED ORDER — ONDANSETRON HCL 4 MG/2ML IJ SOLN
4.0000 mg | Freq: Once | INTRAMUSCULAR | Status: AC | PRN
  Administered 2023-03-05: 4 mg via INTRAVENOUS
  Filled 2023-03-05: qty 2

## 2023-03-05 MED ORDER — POTASSIUM CHLORIDE CRYS ER 20 MEQ PO TBCR
40.0000 meq | EXTENDED_RELEASE_TABLET | Freq: Once | ORAL | Status: AC
  Administered 2023-03-05: 40 meq via ORAL
  Filled 2023-03-05: qty 2

## 2023-03-05 NOTE — ED Triage Notes (Signed)
Sunday evening pt reports body weakness, HA,cough, SOB, and cold symptoms. Reports diarrhea and vomit that started Tuesday.    Reports EMS checking BP yesterday they told him his BP was low and gave bolus.

## 2023-03-05 NOTE — ED Provider Notes (Signed)
 [facility] Provider Note   CSN: [id number] Arrival date & time: 03/05/23  0846     History  Chief Complaint  Patient presents with   Dehydration    Ricardo Melton is a 33 y.o. male.  HPI   33 year old male presents emergency department with complaints of bodyaches, headache, cough, nausea, vomiting, diarrhea.  Patient reports symptoms beginning on Sunday with viral symptoms as well as body aches.  States that on Tuesday, developed vomiting and be today began with diarrhea.  Was seen by EMS yesterday and given 500 cc of normal saline with improvement of symptoms.  Patient states that yesterday, had syncopal episode which is why he called EMS in the first place.  States that he got up from a laying down position and felt dizzy/lightheaded and subsequently lost consciousness for a few seconds.  No trauma from incident.  States that he woke up and felt at baseline.  States that after the saline was given by EMS, noted significant improvement of symptoms.  Because he has continued to have episodes of vomiting as well as now having loose bowel movements, prompted visit to the emergency department.  Denies any fever, chills, chest pain, shortness of breath, hematochezia/melena, hematemesis.  Past medical history significant for schizophrenia  Home Medications Prior to Admission medications   Medication Sig Start Date End Date Taking? Authorizing Provider  busPIRone (BUSPAR) 15 MG tablet Take one tablet (15 mg) in morning and 2 tablet (30 mg) at bed time 09/08/22   Rankin, Shuvon B, NP  carbamazepine (TEGRETOL XR) 100 MG 12 hr tablet Take 3 tablets (300 mg total) by mouth 2 (two) times daily. 09/08/22   Rankin, Shuvon B, NP  ibuprofen (ADVIL) 600 MG tablet Take 1 tablet (600 mg total) by mouth every 6 (six) hours as needed. 03/05/23   Sherian Maroon A, PA  OLANZapine (ZYPREXA) 15 MG tablet Take 2 tablets (30 mg total) by mouth at bedtime. 09/08/22    Rankin, Shuvon B, NP  ondansetron (ZOFRAN-ODT) 4 MG disintegrating tablet Take 1 tablet (4 mg total) by mouth every 8 (eight) hours as needed. 03/05/23   Sherian Maroon A, PA  potassium chloride SA (KLOR-CON M) 20 MEQ tablet Take 1 tablet (20 mEq total) by mouth daily for 3 days. 03/06/23 03/09/23  Peter Garter, PA      Allergies    Patient has no known allergies.    Review of Systems   Review of Systems  All other systems reviewed and are negative.   Physical Exam Updated Vital Signs BP (!) 136/90 (BP Location: Right Arm)   Pulse 96   Temp 98.6 F (37 C) (Oral)   Resp 18   Ht 5\' 7"  (1.702 m)   Wt 63.5 kg   SpO2 99%   BMI 21.93 kg/m  Physical Exam Vitals and nursing note reviewed.  Constitutional:      General: He is not in acute distress.    Appearance: He is well-developed.  HENT:     Head: Normocephalic and atraumatic.     Nose: Congestion and rhinorrhea present.  Eyes:     Conjunctiva/sclera: Conjunctivae normal.  Cardiovascular:     Rate and Rhythm: Normal rate and regular rhythm.     Heart sounds: No murmur heard. Pulmonary:     Effort: Pulmonary effort is normal. No respiratory distress.     Breath sounds: Normal breath sounds. No wheezing, rhonchi or rales.  Abdominal:  Palpations: Abdomen is soft.     Tenderness: There is no abdominal tenderness. There is no guarding.  Musculoskeletal:        General: No swelling.     Cervical back: Neck supple. No rigidity or tenderness.  Skin:    General: Skin is warm and dry.     Capillary Refill: Capillary refill takes less than 2 seconds.  Neurological:     Mental Status: He is alert.     Comments: Alert and oriented to self, place, time and event.   Speech is fluent, clear without dysarthria or dysphasia.   Strength 5/5 in upper/lower extremities   Sensation intact in upper/lower extremities   Normal gait.  CN I not tested  CN II not tested  CN III, IV, VI PERRLA and EOMs intact bilaterally  CN V  Intact sensation to sharp and light touch to the face  CN VII facial movements symmetric  CN VIII not tested  CN IX, X no uvula deviation, symmetric rise of soft palate  CN XI 5/5 SCM and trapezius strength bilaterally  CN XII Midline tongue protrusion, symmetric L/R movements     Psychiatric:        Mood and Affect: Mood normal.     ED Results / Procedures / Treatments   Labs (all labs ordered are listed, but only abnormal results are displayed) Labs Reviewed  RESP PANEL BY RT-PCR (RSV, FLU A&B, COVID)  RVPGX2 - Abnormal; Notable for the following components:      Result Value   Influenza A by PCR POSITIVE (*)    All other components within normal limits  COMPREHENSIVE METABOLIC PANEL - Abnormal; Notable for the following components:   Potassium 3.0 (*)    Chloride 89 (*)    CO2 34 (*)    Glucose, Bld 107 (*)    Creatinine, Ser 1.36 (*)    Total Protein 8.6 (*)    All other components within normal limits  CBC - Abnormal; Notable for the following components:   WBC 2.5 (*)    RBC 5.97 (*)    Hemoglobin 17.5 (*)    HCT 52.1 (*)    All other components within normal limits  DIFFERENTIAL - Abnormal; Notable for the following components:   Neutro Abs 0.7 (*)    All other components within normal limits  LIPASE, BLOOD  MAGNESIUM    EKG None  Radiology No results found.  Procedures Procedures    Medications Ordered in ED Medications  ondansetron (ZOFRAN) injection 4 mg (4 mg Intravenous Given 03/05/23 0916)  sodium chloride 0.9 % bolus 1,000 mL (0 mLs Intravenous Stopped 03/05/23 1103)  sodium chloride 0.9 % bolus 1,000 mL (0 mLs Intravenous Stopped 03/05/23 1103)  potassium chloride SA (KLOR-CON M) CR tablet 40 mEq (40 mEq Oral Given 03/05/23 1025)  ketorolac (TORADOL) 15 MG/ML injection 15 mg (15 mg Intravenous Given 03/05/23 1025)    ED Course/ Medical Decision Making/ A&P                                 Medical Decision Making Amount and/or Complexity of  Data Reviewed Labs: ordered.  Risk Prescription drug management.   This patient presents to the ED for concern of nausea, vomiting, cough, body aches, this involves an extensive number of treatment options, and is a complaint that carries with it a high risk of complications and morbidity.  The differential diagnosis includes  COVID, flu, RSV, norovirus, viral gastroenteritis, pneumonia, sepsis, other   Co morbidities that complicate the patient evaluation  See HPI   Additional history obtained:  Additional history obtained from EMR External records from outside source obtained and reviewed including hospital records   Lab Tests:  I Ordered, and personally interpreted labs.  The pertinent results include: Viral testing positive for flu.  Leukopenia of 2.5.  Polycythemia of 17.5.  Platelets within range.  Hypokalemia, hypochloremia, increase in bicarb of 3.0, 89, 34 respectively most likely from GI loss.  Slight increase of creatinine 1.36, BUN normal at 14 with retention of normal GFR of greater than 60.  No transaminitis.  Lipase and mag within normal limits.   Imaging Studies ordered:  N/a   Cardiac Monitoring: / EKG:  The patient was maintained on a cardiac monitor.  I personally viewed and interpreted the cardiac monitored which showed an underlying rhythm of: Sinus rhythm with early repolarization pattern.   Consultations Obtained:  N/a   Problem List / ED Course / Critical interventions / Medication management  Influenza, body aches, nausea, vomiting, cough I ordered medication including normal saline, Toradol can potassium chloride, Zofran   Reevaluation of the patient after these medicines showed that the patient improved I have reviewed the patients home medicines and have made adjustments as needed   Social Determinants of Health:  Denies tobacco, illicit drug use   Test / Admission - Considered:  Influenza, body aches, nausea, vomiting, cough Vitals  signs within normal range and stable throughout visit. Laboratory/imaging studies significant for: See above 53 year old presents emergency department with complaints of cough, body aches, vomiting, diarrhea.  Symptom onset Sunday with upper respiratory viral symptoms with development of vomiting Tuesday with diarrhea today.  On exam, nontender abdomen.  Lungs clear to auscultation bilaterally.  Workup today concerning for dehydrated status with multiple electrolyte abnormalities and slight elevation of creatinine 1.36.  Treated with IV fluids, oral electrolyte replacement with subsequent passage of p.o. trial.  CT imaging of abdomen for: Given lack of abdominal tenderness.  Patient was with some leukopenia of 2.5 which is most likely secondary to viral infection.  Recommend repeat assessment by primary care in the outpatient setting.  Patient did test positive for influenza A which is most likely causing symptoms.  Recommend continued symptomatic therapy as described in AVS with close follow-up with PCP.  Treatment plan discussed at length with patient and he acknowledged understanding was agreeable to said plan.  Patient overall well-appearing, afebrile in no acute distress. Worrisome signs and symptoms were discussed with the patient, and the patient acknowledged understanding to return to the ED if noticed. Patient was stable upon discharge.          Final Clinical Impression(s) / ED Diagnoses Final diagnoses:  Influenza A    Rx / DC Orders ED Discharge Orders          Ordered    potassium chloride SA (KLOR-CON M) 20 MEQ tablet  Daily,   Status:  Discontinued        03/05/23 1105    potassium chloride SA (KLOR-CON M) 20 MEQ tablet  Daily        03/05/23 1115    ondansetron (ZOFRAN-ODT) 4 MG disintegrating tablet  Every 8 hours PRN,   Status:  Discontinued        02 /13/25 1101    ibuprofen (ADVIL) 600 MG tablet  Every 6 hours PRN,   Status:  Discontinued  03/05/23 1101     ibuprofen (ADVIL) 600 MG tablet  Every 6 hours PRN        03/05/23 1115    ondansetron (ZOFRAN-ODT) 4 MG disintegrating tablet  Every 8 hours PRN        03/05/23 1115              Peter Garter, [place] 03/05/23 1417    Sloan Leiter, DO 03/09/23 [phone]

## 2023-03-05 NOTE — Discharge Instructions (Addendum)
As discussed, your laboratory studies were reassuring besides some electrolyte abnormalities most likely because you have been vomiting and having some diarrhea.  Regarding vomiting, will send you home with nausea/vomiting medicine to use as needed.  This is the same medicine we gave you on the emergency department.  Your potassium was low so we will send you home with a few days worth of potassium at home.  Will recommend oral hydration via electrolyte rich fluids such as Pedialyte, sugar-free Gatorade, Body Armor, liquid IV.  See dietary recommendations on your discharge papers.  If diarrhea becomes persistent/excessive, recommend trying over-the-counter Pepto-Bismol or Imodium.  Recommend follow-up with your primary care for reassessment of your symptoms.  Please do not hesitate to return if the worrisome signs and symptoms we discussed to become apparent.
